# Patient Record
Sex: Male | Born: 1960 | Race: White | Hispanic: No | Smoking: Never smoker
Health system: Southern US, Community
[De-identification: ages and names within clinical notes are randomized; demographics above are authoritative.]

## PROBLEM LIST (undated history)

## (undated) HISTORY — PX: POLYPECTOMY: SHX149

---

## 2015-09-18 ENCOUNTER — Encounter (INDEPENDENT_AMBULATORY_CARE_PROVIDER_SITE_OTHER): Payer: Self-pay

## 2015-09-18 ENCOUNTER — Ambulatory Visit (INDEPENDENT_AMBULATORY_CARE_PROVIDER_SITE_OTHER)
Admission: RE | Admit: 2015-09-18 | Discharge: 2015-09-18 | Disposition: A | Discharge status: Home / Self Care | Payer: 59 | Source: Ambulatory Visit | Attending: Pulmonary Disease | Admitting: Pulmonary Disease

## 2015-09-18 ENCOUNTER — Encounter: Payer: Self-pay | Admitting: Pulmonary Disease

## 2015-09-18 ENCOUNTER — Ambulatory Visit (INDEPENDENT_AMBULATORY_CARE_PROVIDER_SITE_OTHER): Payer: 59 | Admitting: Pulmonary Disease

## 2015-09-18 VITALS — BP 138/82 | HR 89 | Ht 66.5 in | Wt 207.6 lb

## 2015-09-18 DIAGNOSIS — J45990 Exercise induced bronchospasm: Secondary | ICD-10-CM

## 2015-09-18 DIAGNOSIS — R0600 Dyspnea, unspecified: Secondary | ICD-10-CM

## 2015-09-18 MED ORDER — ALBUTEROL SULFATE HFA 108 (90 BASE) MCG/ACT IN AERS: 2.0000 | INHALATION_SPRAY | Freq: Four times a day (QID) | RESPIRATORY_TRACT | Status: AC | PRN

## 2015-09-18 NOTE — Progress Notes (Signed)
Past medical history He  has no past medical history on file.  Past surgical history He  has past surgical history that includes Polypectomy.  Family history His family history includes Colon cancer in his father; Lung cancer in his father.  Social history He  reports that he has never smoked. He does not have any smokeless tobacco history on file. He reports that he does not drink alcohol or use illicit drugs.  No Known Allergies  No current outpatient prescriptions on file prior to visit.   No current facility-administered medications on file prior to visit.    Chief Complaint  Patient presents with  . PULMONARY CONSULT    Increased DOE x 1 year. Self referral.     Vital signs BP 138/82 mmHg  Pulse 89  Ht 5' 6.5" (1.689 m)  Wt 207 lb 9.6 oz (94.167 kg)  BMI 33.01 kg/m2  SpO2 98%  History of present illness [name] [name] is a 55 y.o. male for evaluation of dyspnea.  He is an avid runner, and runs 1/2 marathons.  He has noticed for the past several months it is getting more difficult for him to run.  He will now frequently have to stop and walk because he feels short of breath.    He will feel a tightness in his chest at times when this happens.  His symptoms come on a few minutes after he starts running.  He won't feel better until he slows down.  He gets occasional cough, but no sputum.  He denies hemoptysis or palpitations.  He has not had skin rash or joint pain.  He is not aware of having wheeze.  There is no prior history of asthma, or allergies.  He does not smoke cigarettes.  There is no history of pneumonia or exposure to tuberculosis.  He denies animal/bird exposure, and denies occupational exposures >> works as an Technical sales engineerarchitect.  He has a nephew that has trouble with asthma.  He is not aware of other family members with respiratory problems.  Lab work from 07/07/15 reviewed >> normal except for elevated cholesterol.  Physical exam  General - No distress ENT -  No sinus tenderness, no oral exudate, no LAN, no thyromegaly, TM clear, pupils equal/reactive, scalloped tongue, MP 3 Cardiac - s1s2 regular, no murmur, pulses symmetric Chest - No wheeze/rales/dullness, good air entry, normal respiratory excursion Back - No focal tenderness Abd - Soft, non-tender, no organomegaly, + bowel sounds Ext - No edema Neuro - Normal strength, cranial nerves intact Skin - No rashes Psych - Normal mood, and behavior  ECG today - normal sinus rhythm  Discussion He has dyspnea, cough, and chest tightness with exercise.  His ECG was normal.  I am more concerned that he has exercise induced asthma.   Assessment/plan  Dyspnea with exercise. - chest xray today - will schedule pulmonary function testing - advised him to do high intensity, brief warm routine several minutes prior to starting his full run - he can use ventolin 2 puffs as needed 10 to 15 minutes before starting his run - he might need cardiopulmonary exercise testing to further assess if his symptoms persist    Patient Instructions  Chest xray today Will schedule pulmonary function test Ventolin 2 puffs as needed prior to running Do brief, high intensity warm up prior to running  Follow up in 4 weeks with Dr. Craige CottaSood or nurse practitioner     Coralyn HellingVineet Serjio Deupree, MD [facility] Pager:  [phone] 09/18/2015, 10:48  AM     

## 2015-09-18 NOTE — Patient Instructions (Signed)
Chest xray today Will schedule pulmonary function test Ventolin 2 puffs as needed prior to running Do brief, high intensity warm up prior to running  Follow up in 4 weeks with Dr. Craige CottaSood or nurse practitioner

## 2015-09-18 NOTE — Progress Notes (Signed)
   Subjective:    Patient ID: [name] [name], male    DOB: 1961/05/16, 55 y.o.   MRN: [id number]  HPI    Review of Systems  Constitutional: Positive for unexpected weight change. Negative for fever.  HENT: Negative for congestion, dental problem, ear pain, nosebleeds, postnasal drip, rhinorrhea, sinus pressure, sneezing, sore throat and trouble swallowing.   Eyes: Negative for redness and itching.  Respiratory: Positive for cough and shortness of breath. Negative for chest tightness and wheezing.   Cardiovascular: Negative for palpitations and leg swelling.  Gastrointestinal: Negative for nausea and vomiting.  Genitourinary: Negative for dysuria.  Musculoskeletal: Negative for joint swelling.  Skin: Negative for rash.  Neurological: Negative for headaches.  Hematological: Does not bruise/bleed easily.  Psychiatric/Behavioral: Negative for dysphoric mood. The patient is not nervous/anxious.        Objective:   Physical Exam        Assessment & Plan:

## 2015-09-22 ENCOUNTER — Telehealth: Payer: Self-pay | Admitting: Pulmonary Disease

## 2015-09-22 NOTE — Telephone Encounter (Signed)
Dg Chest 2 View  09/18/2015  CLINICAL DATA:  Dyspnea EXAM: CHEST  2 VIEW COMPARISON:  None. FINDINGS: The heart size and mediastinal contours are within normal limits. Both lungs are clear. The visualized skeletal structures are unremarkable. IMPRESSION: No active cardiopulmonary disease. Electronically Signed   By: Marlan Palauharles  Clark M.D.   On: 09/18/2015 13:20     Will have my nurse inform pt that chest xray was normal.

## 2015-09-23 NOTE — Telephone Encounter (Signed)
LM x 1 

## 2015-09-26 NOTE — Telephone Encounter (Signed)
Patient notified.  No questions or concerns at this time. Nothing further needed.   

## 2015-10-16 ENCOUNTER — Encounter: Payer: Self-pay | Admitting: Adult Health

## 2015-10-16 ENCOUNTER — Ambulatory Visit (INDEPENDENT_AMBULATORY_CARE_PROVIDER_SITE_OTHER): Payer: 59 | Admitting: Adult Health

## 2015-10-16 ENCOUNTER — Ambulatory Visit (HOSPITAL_COMMUNITY)
Admission: RE | Admit: 2015-10-16 | Discharge: 2015-10-16 | Disposition: A | Discharge status: Home / Self Care | Payer: 59 | Source: Ambulatory Visit | Attending: Pulmonary Disease | Admitting: Pulmonary Disease

## 2015-10-16 VITALS — BP 132/80 | HR 68 | Temp 98.2°F | Ht 67.0 in | Wt 204.0 lb

## 2015-10-16 DIAGNOSIS — R0600 Dyspnea, unspecified: Secondary | ICD-10-CM

## 2015-10-16 DIAGNOSIS — R0609 Other forms of dyspnea: Secondary | ICD-10-CM | POA: Diagnosis not present

## 2015-10-16 LAB — PULMONARY FUNCTION TEST
DL/VA % pred: 99 %
DL/VA: 4.41 ml/min/mmHg/L
DLCO UNC % PRED: 93 %
DLCO UNC: 26.49 ml/min/mmHg
FEF 25-75 POST: 4.69 L/s
FEF 25-75 PRE: 3.58 L/s
FEF2575-%Change-Post: 30 %
FEF2575-%PRED-PRE: 125 %
FEF2575-%Pred-Post: 164 %
FEV1-%Change-Post: 7 %
FEV1-%PRED-POST: 112 %
FEV1-%Pred-Pre: 104 %
FEV1-POST: 3.73 L
FEV1-Pre: 3.48 L
FEV1FVC-%Change-Post: 2 %
FEV1FVC-%Pred-Pre: 108 %
FEV6-%CHANGE-POST: 4 %
FEV6-%PRED-POST: 105 %
FEV6-%PRED-PRE: 101 %
FEV6-PRE: 4.22 L
FEV6-Post: 4.4 L
FEV6FVC-%PRED-PRE: 105 %
FEV6FVC-%Pred-Post: 105 %
FVC-%CHANGE-POST: 4 %
FVC-%PRED-POST: 100 %
FVC-%Pred-Pre: 96 %
FVC-Post: 4.4 L
FVC-Pre: 4.22 L
POST FEV6/FVC RATIO: 100 %
PRE FEV1/FVC RATIO: 83 %
Post FEV1/FVC ratio: 85 %
Pre FEV6/FVC Ratio: 100 %
RV % pred: 102 %
RV: 2.07 L
TLC % pred: 105 %
TLC: 6.74 L

## 2015-10-16 MED ORDER — ALBUTEROL SULFATE (2.5 MG/3ML) 0.083% IN NEBU
2.5000 mg | INHALATION_SOLUTION | Freq: Once | RESPIRATORY_TRACT | Status: AC
  Administered 2015-10-16: 2.5 mg via RESPIRATORY_TRACT

## 2015-10-16 NOTE — Progress Notes (Signed)
Subjective:    Patient ID: [name] [name], male    DOB: 08/30/1960, 55 y.o.   MRN: [id number]  HPI 55 yo male never smoker seen for pulmonary consult for dyspnea w/ exercise 09/18/15 -concerning for exercise induced asthma.     10/16/2015 Follow up : DOE  Pt returns for 1 month follow up . Pt was seen 1 month ago for sob that occurs with running only . He was started on albuterol pre exercise. He has not seen much change in DOE-maybe only slightly improved. Marland Kitchen. He feels it is related to his weight gain.  Has gained 10lbs in last but 20lbs in last 3510yrs.  Has been Running for last 5210yrs , running in 1/2 marathons and in running club.  PFT today shows normal lung function with FEV 1 112%, ratio 85  , FVC 100% , No sign BD response. nml dlco .  CXR 09/18/15 nml .  No chest pain, tightness, syncope, dizziness, calf pain, orthopnea, palpitations.  FH Great GF with MI in 3650s.     No past medical history on file. Current Outpatient Prescriptions on File Prior to Visit  Medication Sig Dispense Refill  . albuterol (VENTOLIN HFA) 108 (90 Base) MCG/ACT inhaler Inhale 2 puffs into the lungs every 6 (six) hours as needed for wheezing or shortness of breath. 1 Inhaler 5  . Biotin (BIOTIN 5000) 5 MG CAPS Take 5,000 mg by mouth daily.    . Multiple Vitamins-Minerals (OCUVITE EYE + MULTI PO) Take 1 capsule by mouth daily.    . Omega-3 Fatty Acids (FISH OIL PO) Take 2 capsules by mouth daily. 1050mg  each     No current facility-administered medications on file prior to visit.     Review of Systems Constitutional:   No  weight loss, night sweats,  Fevers, chills, fatigue, or  lassitude.  HEENT:   No headaches,  Difficulty swallowing,  Tooth/dental problems, or  Sore throat,                No sneezing, itching, ear ache, nasal congestion, post nasal drip,   CV:  No chest pain,  Orthopnea, PND, swelling in lower extremities, anasarca, dizziness, palpitations, syncope.   GI  No heartburn, indigestion,  abdominal pain, nausea, vomiting, diarrhea, change in bowel habits, loss of appetite, bloody stools.   Resp:  .  No excess mucus, no productive cough,  No non-productive cough,  No coughing up of blood.  No change in color of mucus.  No wheezing.  No chest wall deformity  Skin: no rash or lesions.  GU: no dysuria, change in color of urine, no urgency or frequency.  No flank pain, no hematuria   MS:  No joint pain or swelling.  No decreased range of motion.  No back pain.  Psych:  No change in mood or affect. No depression or anxiety.  No memory loss.         Objective:   Physical Exam  Filed Vitals:   10/16/15 1416  BP: 132/80  Pulse: 68  Temp: 98.2 F (36.8 C)  TempSrc: Oral  Height: 5\' 7"  (1.702 m)  Weight: 204 lb (92.534 kg)  SpO2: 95%  Body mass index is 31.94 kg/(m^2).   GEN: A/Ox3; pleasant , NAD, well nourished   HEENT:  [place]/AT,  EACs-clear, TMs-wnl, NOSE-clear, THROAT-clear, no lesions, no postnasal drip or exudate noted.   NECK:  Supple w/ fair ROM; no JVD; normal carotid impulses w/o bruits; no thyromegaly or nodules palpated;  no lymphadenopathy.  RESP  Clear  P & A; w/o, wheezes/ rales/ or rhonchi.no accessory muscle use, no dullness to percussion  CARD:  RRR, no m/r/g  , no peripheral edema, pulses intact, no cyanosis or clubbing.  GI:   Soft & nt; nml bowel sounds; no organomegaly or masses detected.  Musco: Warm bil, no deformities or joint swelling noted.   Neuro: alert, no focal deficits noted.    Skin: Warm, no lesions or rashes  Tammy Parrett NP-C  [facility]  10/16/2015      Assessment & Plan:

## 2015-10-16 NOTE — Patient Instructions (Signed)
May use Ventolin 2 puffs as needed prior to running Recommend Cardiopulmonary stress test or Cardiology referral call back if you change your mind.  follow up Dr. Craige CottaSood  In 4-6 months and As needed

## 2015-10-17 DIAGNOSIS — R0609 Other forms of dyspnea: Secondary | ICD-10-CM | POA: Insufficient documentation

## 2015-10-17 NOTE — Assessment & Plan Note (Signed)
Dyspnea with exercise ? Etiology  May have component of exercise induced asthma , PFT is normal with no restriction or obstruction.  May cont to use albuterol if perceived benefit Advised since we do not have a clear cut answer to explain DOE , should procede with CPST or cardiology referral  He declines. He is convinced this is weight related and deconditioning.  He wants to lose weight and then reevaluate if does not resolve Advised of cardiac potential. He declines referral at this time.   Plan  ollow med calendar closely and bring to each visit.  Please contact office for sooner follow up if symptoms do not improve or worsen or seek emergency care   Saline nasal rinses as needed.  Saline nasal gel At bedtime  As needed   Follow up Dr. Sherene SiresWert  In 3-4 months and As needed.  Pneumovax vaccine today .

## 2015-10-20 NOTE — Progress Notes (Signed)
Reviewed and agree with assessment/plan.  Coralyn HellingVineet Aleksa Collinsworth, MD [facility] 10/20/2015, 3:14 PM Pager:  [phone]

## 2015-10-23 ENCOUNTER — Encounter: Payer: Self-pay | Admitting: Pulmonary Disease

## 2017-07-07 DIAGNOSIS — Z0000 Encounter for general adult medical examination without abnormal findings: Secondary | ICD-10-CM | POA: Diagnosis not present

## 2017-07-07 DIAGNOSIS — Z125 Encounter for screening for malignant neoplasm of prostate: Secondary | ICD-10-CM | POA: Diagnosis not present

## 2017-07-14 DIAGNOSIS — Z1331 Encounter for screening for depression: Secondary | ICD-10-CM | POA: Diagnosis not present

## 2017-07-14 DIAGNOSIS — Z0000 Encounter for general adult medical examination without abnormal findings: Secondary | ICD-10-CM | POA: Diagnosis not present

## 2017-07-14 DIAGNOSIS — I10 Essential (primary) hypertension: Secondary | ICD-10-CM | POA: Diagnosis not present

## 2017-07-14 DIAGNOSIS — D6489 Other specified anemias: Secondary | ICD-10-CM | POA: Diagnosis not present

## 2017-07-14 DIAGNOSIS — E7800 Pure hypercholesterolemia, unspecified: Secondary | ICD-10-CM | POA: Diagnosis not present

## 2017-07-14 DIAGNOSIS — K900 Celiac disease: Secondary | ICD-10-CM | POA: Diagnosis not present

## 2017-07-14 DIAGNOSIS — Z1389 Encounter for screening for other disorder: Secondary | ICD-10-CM | POA: Diagnosis not present

## 2017-08-29 DIAGNOSIS — L820 Inflamed seborrheic keratosis: Secondary | ICD-10-CM | POA: Diagnosis not present

## 2017-08-29 DIAGNOSIS — D1801 Hemangioma of skin and subcutaneous tissue: Secondary | ICD-10-CM | POA: Diagnosis not present

## 2017-08-29 DIAGNOSIS — C44329 Squamous cell carcinoma of skin of other parts of face: Secondary | ICD-10-CM | POA: Diagnosis not present

## 2017-09-21 DIAGNOSIS — C44329 Squamous cell carcinoma of skin of other parts of face: Secondary | ICD-10-CM | POA: Diagnosis not present

## 2017-09-27 DIAGNOSIS — D485 Neoplasm of uncertain behavior of skin: Secondary | ICD-10-CM | POA: Diagnosis not present

## 2017-09-27 DIAGNOSIS — L814 Other melanin hyperpigmentation: Secondary | ICD-10-CM | POA: Diagnosis not present

## 2017-10-05 DIAGNOSIS — K900 Celiac disease: Secondary | ICD-10-CM | POA: Diagnosis not present

## 2017-10-05 DIAGNOSIS — M8580 Other specified disorders of bone density and structure, unspecified site: Secondary | ICD-10-CM | POA: Diagnosis not present

## 2017-10-05 DIAGNOSIS — D509 Iron deficiency anemia, unspecified: Secondary | ICD-10-CM | POA: Diagnosis not present

## 2018-04-10 DIAGNOSIS — Z85828 Personal history of other malignant neoplasm of skin: Secondary | ICD-10-CM | POA: Diagnosis not present

## 2018-04-10 DIAGNOSIS — D2261 Melanocytic nevi of right upper limb, including shoulder: Secondary | ICD-10-CM | POA: Diagnosis not present

## 2018-04-10 DIAGNOSIS — L570 Actinic keratosis: Secondary | ICD-10-CM | POA: Diagnosis not present

## 2018-04-10 DIAGNOSIS — C44519 Basal cell carcinoma of skin of other part of trunk: Secondary | ICD-10-CM | POA: Diagnosis not present

## 2018-09-13 DIAGNOSIS — E7800 Pure hypercholesterolemia, unspecified: Secondary | ICD-10-CM | POA: Diagnosis not present

## 2018-09-13 DIAGNOSIS — Z125 Encounter for screening for malignant neoplasm of prostate: Secondary | ICD-10-CM | POA: Diagnosis not present

## 2018-09-13 DIAGNOSIS — R8290 Unspecified abnormal findings in urine: Secondary | ICD-10-CM | POA: Diagnosis not present

## 2018-09-13 DIAGNOSIS — I10 Essential (primary) hypertension: Secondary | ICD-10-CM | POA: Diagnosis not present

## 2018-09-13 DIAGNOSIS — Z0000 Encounter for general adult medical examination without abnormal findings: Secondary | ICD-10-CM | POA: Diagnosis not present

## 2018-09-19 DIAGNOSIS — N3281 Overactive bladder: Secondary | ICD-10-CM | POA: Diagnosis not present

## 2018-09-19 DIAGNOSIS — I10 Essential (primary) hypertension: Secondary | ICD-10-CM | POA: Diagnosis not present

## 2018-09-19 DIAGNOSIS — Z1331 Encounter for screening for depression: Secondary | ICD-10-CM | POA: Diagnosis not present

## 2018-09-19 DIAGNOSIS — E291 Testicular hypofunction: Secondary | ICD-10-CM | POA: Diagnosis not present

## 2018-09-19 DIAGNOSIS — E7800 Pure hypercholesterolemia, unspecified: Secondary | ICD-10-CM | POA: Diagnosis not present

## 2018-09-19 DIAGNOSIS — Z0000 Encounter for general adult medical examination without abnormal findings: Secondary | ICD-10-CM | POA: Diagnosis not present

## 2019-04-13 DIAGNOSIS — C44519 Basal cell carcinoma of skin of other part of trunk: Secondary | ICD-10-CM | POA: Diagnosis not present

## 2019-04-13 DIAGNOSIS — L814 Other melanin hyperpigmentation: Secondary | ICD-10-CM | POA: Diagnosis not present

## 2019-04-13 DIAGNOSIS — L853 Xerosis cutis: Secondary | ICD-10-CM | POA: Diagnosis not present

## 2019-04-13 DIAGNOSIS — D1801 Hemangioma of skin and subcutaneous tissue: Secondary | ICD-10-CM | POA: Diagnosis not present

## 2019-04-13 DIAGNOSIS — L570 Actinic keratosis: Secondary | ICD-10-CM | POA: Diagnosis not present

## 2019-04-13 DIAGNOSIS — Z85828 Personal history of other malignant neoplasm of skin: Secondary | ICD-10-CM | POA: Diagnosis not present

## 2019-06-13 DIAGNOSIS — K900 Celiac disease: Secondary | ICD-10-CM | POA: Diagnosis not present

## 2019-06-13 DIAGNOSIS — D509 Iron deficiency anemia, unspecified: Secondary | ICD-10-CM | POA: Diagnosis not present

## 2019-06-27 DIAGNOSIS — D509 Iron deficiency anemia, unspecified: Secondary | ICD-10-CM | POA: Diagnosis not present

## 2019-06-27 DIAGNOSIS — K900 Celiac disease: Secondary | ICD-10-CM | POA: Diagnosis not present

## 2019-10-23 DIAGNOSIS — Z0000 Encounter for general adult medical examination without abnormal findings: Secondary | ICD-10-CM | POA: Diagnosis not present

## 2019-10-23 DIAGNOSIS — Z125 Encounter for screening for malignant neoplasm of prostate: Secondary | ICD-10-CM | POA: Diagnosis not present

## 2019-10-23 DIAGNOSIS — E7800 Pure hypercholesterolemia, unspecified: Secondary | ICD-10-CM | POA: Diagnosis not present

## 2019-10-23 DIAGNOSIS — E291 Testicular hypofunction: Secondary | ICD-10-CM | POA: Diagnosis not present

## 2019-10-30 ENCOUNTER — Other Ambulatory Visit: Payer: Self-pay | Admitting: Internal Medicine

## 2019-10-30 DIAGNOSIS — Z1331 Encounter for screening for depression: Secondary | ICD-10-CM | POA: Diagnosis not present

## 2019-10-30 DIAGNOSIS — R82998 Other abnormal findings in urine: Secondary | ICD-10-CM | POA: Diagnosis not present

## 2019-10-30 DIAGNOSIS — D126 Benign neoplasm of colon, unspecified: Secondary | ICD-10-CM | POA: Diagnosis not present

## 2019-10-30 DIAGNOSIS — E7800 Pure hypercholesterolemia, unspecified: Secondary | ICD-10-CM | POA: Diagnosis not present

## 2019-10-30 DIAGNOSIS — K900 Celiac disease: Secondary | ICD-10-CM | POA: Diagnosis not present

## 2019-10-30 DIAGNOSIS — I10 Essential (primary) hypertension: Secondary | ICD-10-CM | POA: Diagnosis not present

## 2019-10-30 DIAGNOSIS — Z0000 Encounter for general adult medical examination without abnormal findings: Secondary | ICD-10-CM | POA: Diagnosis not present

## 2019-12-03 ENCOUNTER — Ambulatory Visit
Admission: RE | Admit: 2019-12-03 | Discharge: 2019-12-03 | Disposition: A | Discharge status: Home / Self Care | Payer: 59 | Source: Ambulatory Visit | Attending: Internal Medicine | Admitting: Internal Medicine

## 2019-12-03 DIAGNOSIS — E7800 Pure hypercholesterolemia, unspecified: Secondary | ICD-10-CM

## 2019-12-17 ENCOUNTER — Encounter: Payer: Self-pay | Admitting: General Practice

## 2020-04-18 DIAGNOSIS — L814 Other melanin hyperpigmentation: Secondary | ICD-10-CM | POA: Diagnosis not present

## 2020-04-18 DIAGNOSIS — L570 Actinic keratosis: Secondary | ICD-10-CM | POA: Diagnosis not present

## 2020-04-18 DIAGNOSIS — Z85828 Personal history of other malignant neoplasm of skin: Secondary | ICD-10-CM | POA: Diagnosis not present

## 2020-04-18 DIAGNOSIS — D1801 Hemangioma of skin and subcutaneous tissue: Secondary | ICD-10-CM | POA: Diagnosis not present

## 2020-04-18 DIAGNOSIS — L820 Inflamed seborrheic keratosis: Secondary | ICD-10-CM | POA: Diagnosis not present

## 2020-10-08 DIAGNOSIS — K900 Celiac disease: Secondary | ICD-10-CM | POA: Diagnosis not present

## 2020-10-15 DIAGNOSIS — K900 Celiac disease: Secondary | ICD-10-CM | POA: Diagnosis not present

## 2020-10-15 DIAGNOSIS — Z86010 Personal history of colonic polyps: Secondary | ICD-10-CM | POA: Diagnosis not present

## 2020-10-15 DIAGNOSIS — K625 Hemorrhage of anus and rectum: Secondary | ICD-10-CM | POA: Diagnosis not present

## 2020-10-15 DIAGNOSIS — Z800 Family history of malignant neoplasm of digestive organs: Secondary | ICD-10-CM | POA: Diagnosis not present

## 2020-12-02 DIAGNOSIS — Z800 Family history of malignant neoplasm of digestive organs: Secondary | ICD-10-CM | POA: Diagnosis not present

## 2020-12-02 DIAGNOSIS — D120 Benign neoplasm of cecum: Secondary | ICD-10-CM | POA: Diagnosis not present

## 2020-12-02 DIAGNOSIS — Z86010 Personal history of colonic polyps: Secondary | ICD-10-CM | POA: Diagnosis not present

## 2021-01-13 DIAGNOSIS — E291 Testicular hypofunction: Secondary | ICD-10-CM | POA: Diagnosis not present

## 2021-01-13 DIAGNOSIS — E7800 Pure hypercholesterolemia, unspecified: Secondary | ICD-10-CM | POA: Diagnosis not present

## 2021-01-13 DIAGNOSIS — R7301 Impaired fasting glucose: Secondary | ICD-10-CM | POA: Diagnosis not present

## 2021-01-13 DIAGNOSIS — Z125 Encounter for screening for malignant neoplasm of prostate: Secondary | ICD-10-CM | POA: Diagnosis not present

## 2021-01-20 DIAGNOSIS — Z0000 Encounter for general adult medical examination without abnormal findings: Secondary | ICD-10-CM | POA: Diagnosis not present

## 2021-01-20 DIAGNOSIS — Z1339 Encounter for screening examination for other mental health and behavioral disorders: Secondary | ICD-10-CM | POA: Diagnosis not present

## 2021-01-20 DIAGNOSIS — Z1331 Encounter for screening for depression: Secondary | ICD-10-CM | POA: Diagnosis not present

## 2021-01-20 DIAGNOSIS — I10 Essential (primary) hypertension: Secondary | ICD-10-CM | POA: Diagnosis not present

## 2021-01-20 DIAGNOSIS — R82998 Other abnormal findings in urine: Secondary | ICD-10-CM | POA: Diagnosis not present

## 2021-03-23 DIAGNOSIS — Z03818 Encounter for observation for suspected exposure to other biological agents ruled out: Secondary | ICD-10-CM | POA: Diagnosis not present

## 2021-03-23 DIAGNOSIS — U071 COVID-19: Secondary | ICD-10-CM | POA: Diagnosis not present

## 2021-04-07 DIAGNOSIS — U071 COVID-19: Secondary | ICD-10-CM | POA: Diagnosis not present

## 2021-04-20 DIAGNOSIS — L570 Actinic keratosis: Secondary | ICD-10-CM | POA: Diagnosis not present

## 2021-04-20 DIAGNOSIS — L820 Inflamed seborrheic keratosis: Secondary | ICD-10-CM | POA: Diagnosis not present

## 2021-04-20 DIAGNOSIS — D2261 Melanocytic nevi of right upper limb, including shoulder: Secondary | ICD-10-CM | POA: Diagnosis not present

## 2021-04-20 DIAGNOSIS — L814 Other melanin hyperpigmentation: Secondary | ICD-10-CM | POA: Diagnosis not present

## 2021-04-20 DIAGNOSIS — D1801 Hemangioma of skin and subcutaneous tissue: Secondary | ICD-10-CM | POA: Diagnosis not present

## 2021-04-20 DIAGNOSIS — Z85828 Personal history of other malignant neoplasm of skin: Secondary | ICD-10-CM | POA: Diagnosis not present

## 2021-06-20 IMAGING — CT CT CARDIAC CORONARY ARTERY CALCIUM SCORE
3 series · 14 of 20 positions shown, 16 images · non-contrast
Comparison: None.

CLINICAL DATA: 59-year-old white male with history of increased
cholesterol.

EXAM:
CT CARDIAC CORONARY ARTERY CALCIUM SCORE
TECHNIQUE: Non-contrast imaging through the heart was performed using
prospective ECG gating. Image post processing was performed on an
independent workstation, allowing for quantitative analysis of the
heart and coronary arteries. Note that this exam targets the heart
and the chest was not imaged in its entirety.

[Series 2: calcium scoring 2.00 qr36 bestdiast 71% hrt calciu · axial · 0.49mm/px · z∈[+1447,+1531]mm · 4 of 70 slices shown]
[im 14/70  vessel]
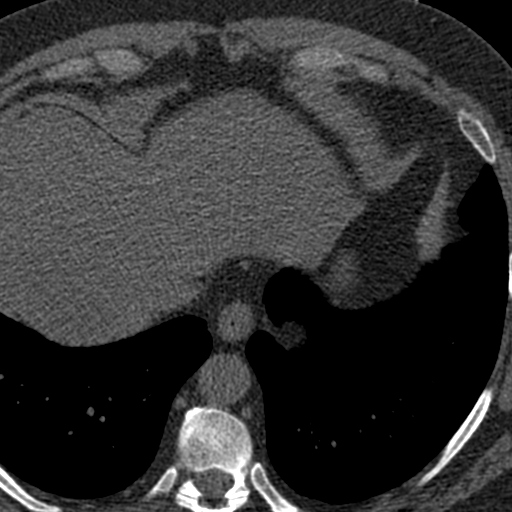
[im 28/70  vessel]
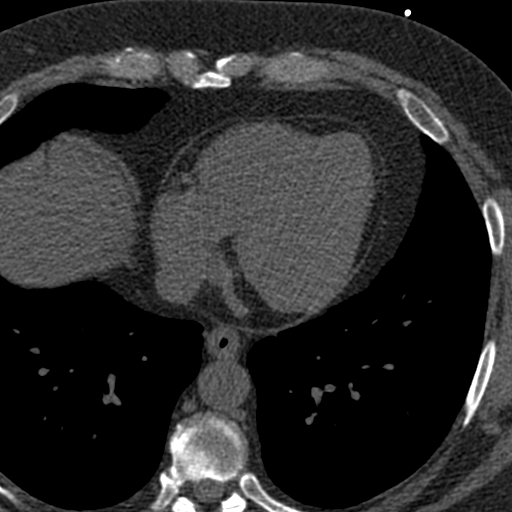
[im 42/70  vessel]
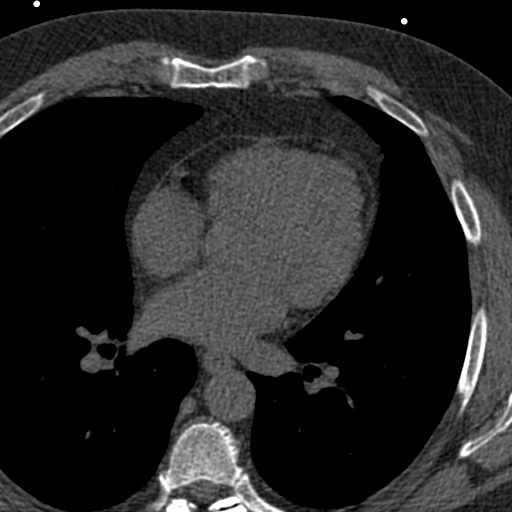
[im 56/70  vessel]
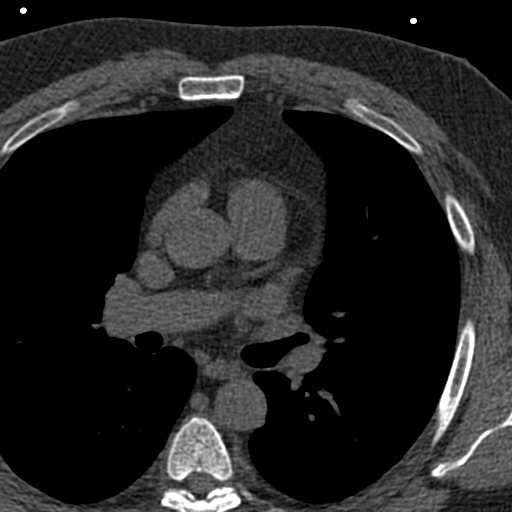

[Series 3: calcium scoring 2.00 br40 bestdiast 71% axial · axial · 0.63mm/px · z∈[+1443,+1535]mm · 5 of 70 slices shown, 7 images]
[im 12/70  vessel]
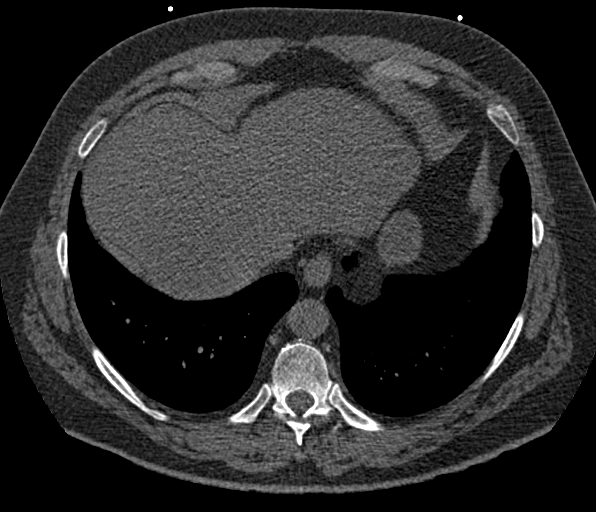
[im 12/70  lung]
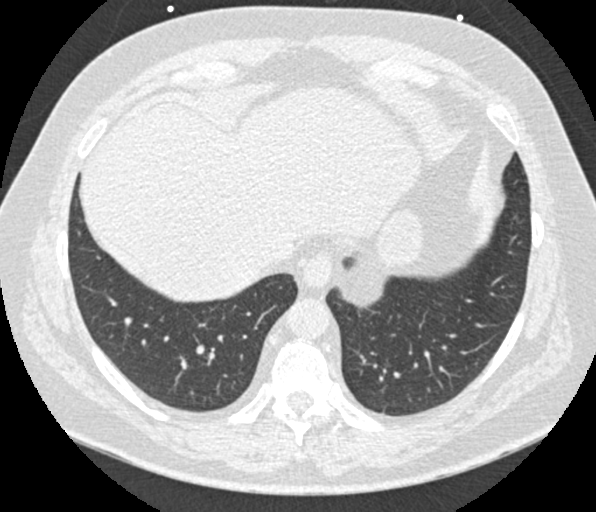
[im 24/70  vessel]
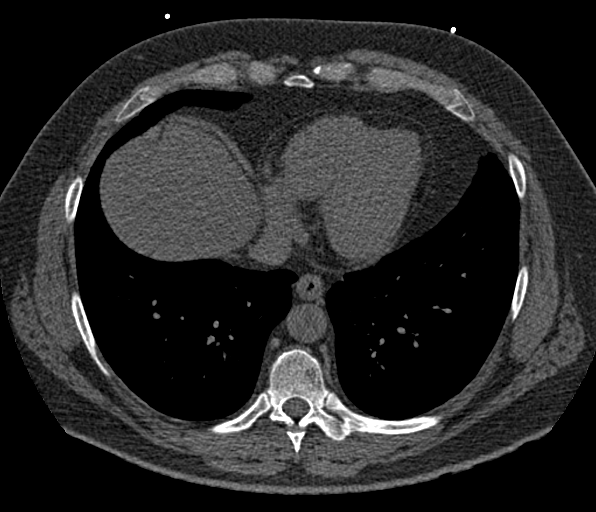
[im 35/70  vessel]
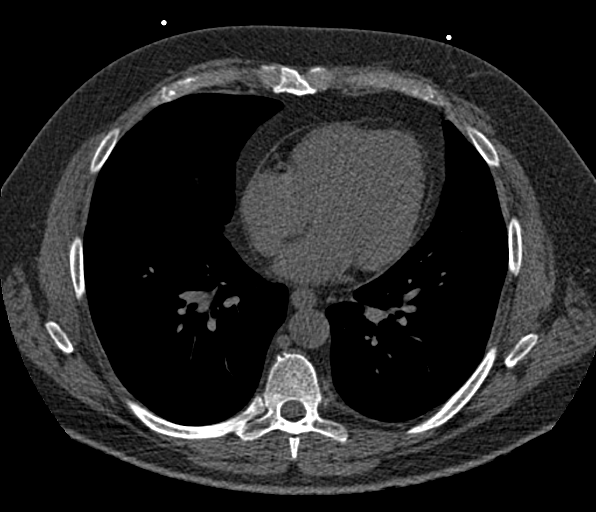
[im 47/70  vessel]
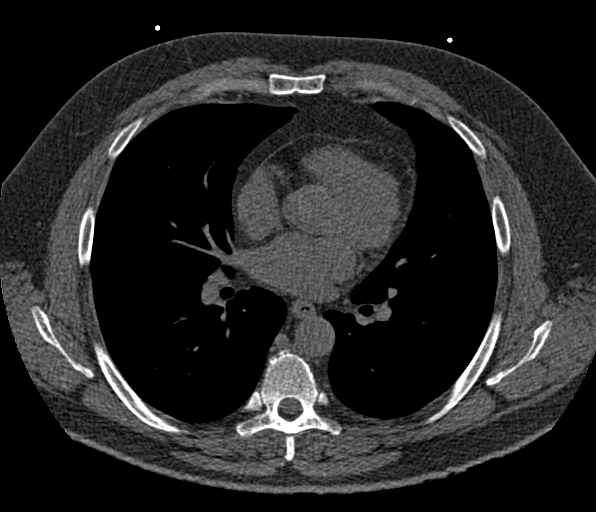
[im 58/70  vessel]
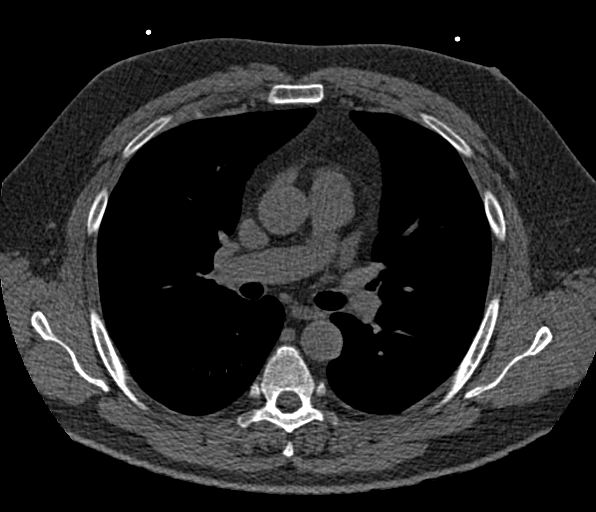
[im 58/70  lung]
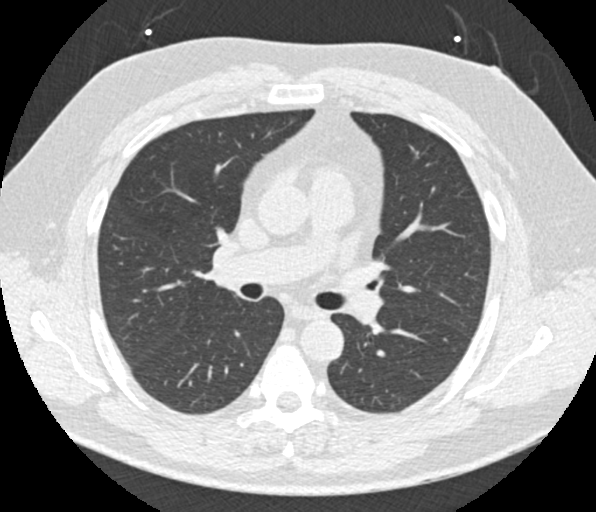

[Series 9: calcium scoring 2.00 br60 bestdiast 71% lungs · axial · 0.63mm/px · z∈[+1443,+1535]mm · 5 of 70 slices shown]
[im 12/70  vessel]
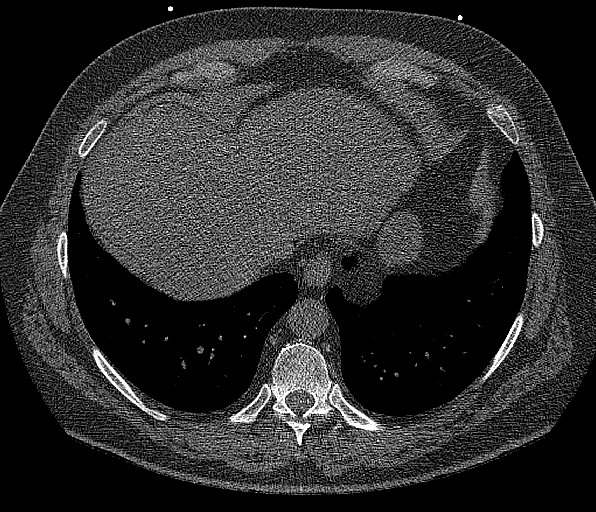
[im 24/70  vessel]
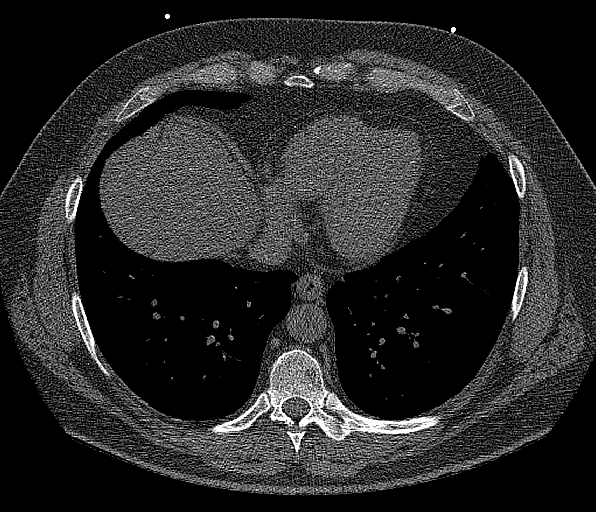
[im 35/70  vessel]
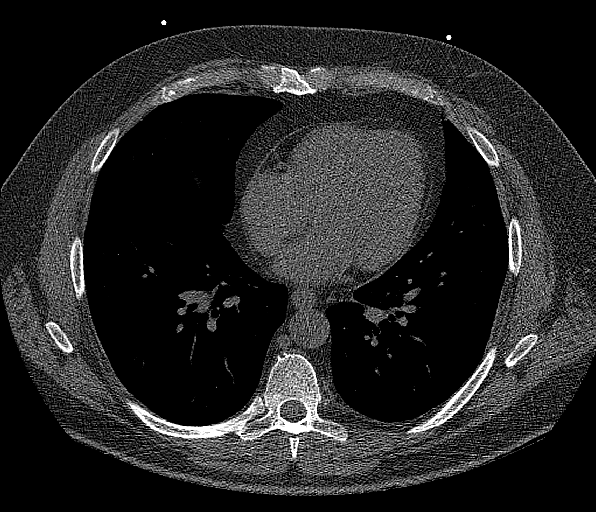
[im 47/70  vessel]
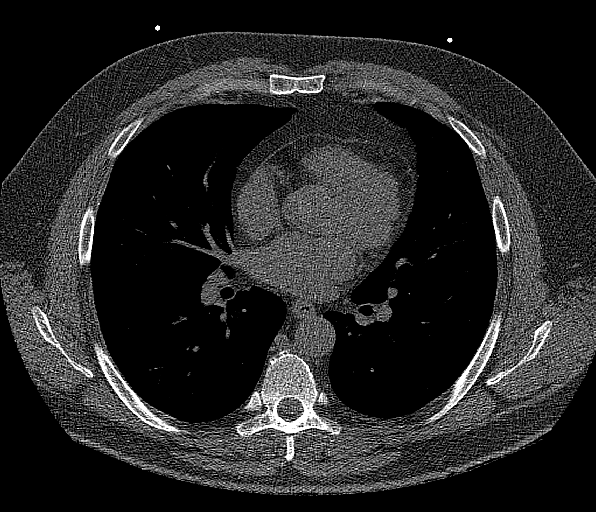
[im 58/70  vessel]
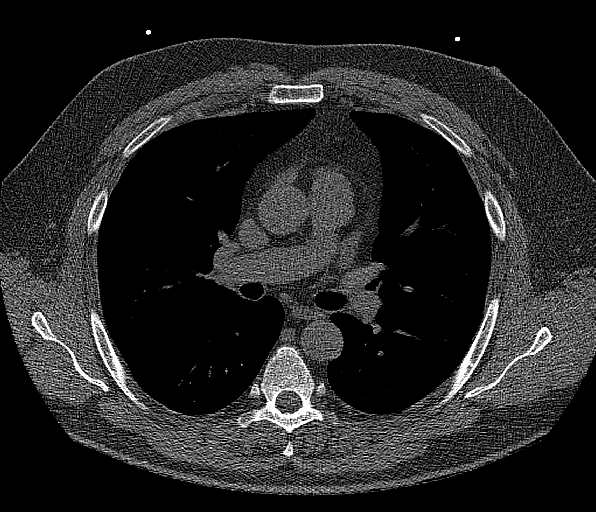

[14 of 20 positions shown; findings below may reference images not displayed]

FINDINGS: CORONARY CALCIUM SCORES:

Left Main: 0

LAD: 174

LCx:

RCA:

Total Agatston Score: 250

[HOSPITAL] percentile: 80

AORTA MEASUREMENTS:

Ascending Aorta: 31 mm

Descending Aorta: 27 mm

OTHER FINDINGS:

Heart size is normal. No significant pericardial fluid. No definite
calcifications in the visualized portions of the thoracic aorta.
Scattered small mediastinal lymph nodes. Images of upper abdomen are
unremarkable. Tiny pleural-based calcification in the right middle
lobe region on sequence 9, image 27 is suggestive for a tiny
calcified granuloma. Focal thickening along the left major fissure
on sequence 9, image 18 measures roughly 5 mm. Tiny peripheral
nodule in the lingula on sequence 9, image 37. Noncalcified
peripheral nodule in the left lower lobe on sequence 9 image 45
measures up to 5 mm. No large pleural effusions. No significant
airspace disease or lung consolidation. Bone structures are
unremarkable.
IMPRESSION: 1. Coronary calcium score is 250 and this is at percentile 80 for
patients of the same age, gender and ethnicity.
2. Small pulmonary nodules, predominantly peripheral and pleural
based. Largest pulmonary nodule measures 5 mm. No follow-up needed
if patient is low-risk (and has no known or suspected primary
neoplasm). Non-contrast chest CT can be considered in 12 months if
patient is high-risk. This recommendation follows the consensus
statement: Guidelines for Management of Incidental Pulmonary Nodules
Detected on CT Images: From the [HOSPITAL] 6833; Radiology

## 2022-01-19 DIAGNOSIS — E7800 Pure hypercholesterolemia, unspecified: Secondary | ICD-10-CM | POA: Diagnosis not present

## 2022-01-19 DIAGNOSIS — E291 Testicular hypofunction: Secondary | ICD-10-CM | POA: Diagnosis not present

## 2022-01-19 DIAGNOSIS — Z125 Encounter for screening for malignant neoplasm of prostate: Secondary | ICD-10-CM | POA: Diagnosis not present

## 2022-01-19 DIAGNOSIS — D649 Anemia, unspecified: Secondary | ICD-10-CM | POA: Diagnosis not present

## 2022-01-19 DIAGNOSIS — R7301 Impaired fasting glucose: Secondary | ICD-10-CM | POA: Diagnosis not present

## 2022-01-26 DIAGNOSIS — R7301 Impaired fasting glucose: Secondary | ICD-10-CM | POA: Diagnosis not present

## 2022-01-26 DIAGNOSIS — Z1331 Encounter for screening for depression: Secondary | ICD-10-CM | POA: Diagnosis not present

## 2022-01-26 DIAGNOSIS — Z1339 Encounter for screening examination for other mental health and behavioral disorders: Secondary | ICD-10-CM | POA: Diagnosis not present

## 2022-01-26 DIAGNOSIS — I10 Essential (primary) hypertension: Secondary | ICD-10-CM | POA: Diagnosis not present

## 2022-01-26 DIAGNOSIS — Z0000 Encounter for general adult medical examination without abnormal findings: Secondary | ICD-10-CM | POA: Diagnosis not present

## 2022-01-26 DIAGNOSIS — R82998 Other abnormal findings in urine: Secondary | ICD-10-CM | POA: Diagnosis not present

## 2022-04-26 DIAGNOSIS — C4441 Basal cell carcinoma of skin of scalp and neck: Secondary | ICD-10-CM | POA: Diagnosis not present

## 2022-04-26 DIAGNOSIS — L570 Actinic keratosis: Secondary | ICD-10-CM | POA: Diagnosis not present

## 2022-04-26 DIAGNOSIS — D1801 Hemangioma of skin and subcutaneous tissue: Secondary | ICD-10-CM | POA: Diagnosis not present

## 2022-04-26 DIAGNOSIS — L814 Other melanin hyperpigmentation: Secondary | ICD-10-CM | POA: Diagnosis not present

## 2022-04-26 DIAGNOSIS — Z85828 Personal history of other malignant neoplasm of skin: Secondary | ICD-10-CM | POA: Diagnosis not present

## 2022-04-26 DIAGNOSIS — L820 Inflamed seborrheic keratosis: Secondary | ICD-10-CM | POA: Diagnosis not present

## 2023-01-24 DIAGNOSIS — Z125 Encounter for screening for malignant neoplasm of prostate: Secondary | ICD-10-CM | POA: Diagnosis not present

## 2023-01-24 DIAGNOSIS — D649 Anemia, unspecified: Secondary | ICD-10-CM | POA: Diagnosis not present

## 2023-01-24 DIAGNOSIS — Z1212 Encounter for screening for malignant neoplasm of rectum: Secondary | ICD-10-CM | POA: Diagnosis not present

## 2023-01-24 DIAGNOSIS — I10 Essential (primary) hypertension: Secondary | ICD-10-CM | POA: Diagnosis not present

## 2023-01-24 DIAGNOSIS — E7800 Pure hypercholesterolemia, unspecified: Secondary | ICD-10-CM | POA: Diagnosis not present

## 2023-01-24 DIAGNOSIS — E291 Testicular hypofunction: Secondary | ICD-10-CM | POA: Diagnosis not present

## 2023-01-31 DIAGNOSIS — R82998 Other abnormal findings in urine: Secondary | ICD-10-CM | POA: Diagnosis not present

## 2023-01-31 DIAGNOSIS — Z0000 Encounter for general adult medical examination without abnormal findings: Secondary | ICD-10-CM | POA: Diagnosis not present

## 2023-01-31 DIAGNOSIS — I10 Essential (primary) hypertension: Secondary | ICD-10-CM | POA: Diagnosis not present

## 2023-04-27 DIAGNOSIS — L814 Other melanin hyperpigmentation: Secondary | ICD-10-CM | POA: Diagnosis not present

## 2023-04-27 DIAGNOSIS — C4441 Basal cell carcinoma of skin of scalp and neck: Secondary | ICD-10-CM | POA: Diagnosis not present

## 2023-04-27 DIAGNOSIS — D1801 Hemangioma of skin and subcutaneous tissue: Secondary | ICD-10-CM | POA: Diagnosis not present

## 2023-04-27 DIAGNOSIS — Z85828 Personal history of other malignant neoplasm of skin: Secondary | ICD-10-CM | POA: Diagnosis not present

## 2023-04-27 DIAGNOSIS — L570 Actinic keratosis: Secondary | ICD-10-CM | POA: Diagnosis not present

## 2023-05-01 DIAGNOSIS — R509 Fever, unspecified: Secondary | ICD-10-CM | POA: Diagnosis not present

## 2023-05-01 DIAGNOSIS — J028 Acute pharyngitis due to other specified organisms: Secondary | ICD-10-CM | POA: Diagnosis not present

## 2023-05-01 DIAGNOSIS — B9689 Other specified bacterial agents as the cause of diseases classified elsewhere: Secondary | ICD-10-CM | POA: Diagnosis not present

## 2023-05-13 ENCOUNTER — Other Ambulatory Visit: Payer: Self-pay

## 2023-05-13 ENCOUNTER — Ambulatory Visit (INDEPENDENT_AMBULATORY_CARE_PROVIDER_SITE_OTHER): Payer: BC Managed Care – PPO | Admitting: Sports Medicine

## 2023-05-13 VITALS — BP 124/88 | Ht 67.0 in | Wt 220.0 lb

## 2023-05-13 DIAGNOSIS — M25561 Pain in right knee: Secondary | ICD-10-CM | POA: Diagnosis not present

## 2023-05-13 MED ORDER — MELOXICAM 15 MG PO TABS: 15.0000 mg | ORAL_TABLET | Freq: Every day | ORAL | 0 refills | Status: AC

## 2023-05-13 NOTE — Patient Instructions (Signed)
You were seen for right knee pain. I think this is a transient synovitis with prepatellar bursitis. I have sent 3 weeks of Meloxicam (NSAID) to take once daily for the next 14 days as needed, though you can continue for the full 3 weeks if needed. Icing and keeping compression on the knee can help with pain, swelling and inflammation.  Let's follow-up in 2-3 weeks, sooner if the knee becomes red, swollen or if you are unable to weight bear on that leg.

## 2023-05-13 NOTE — Progress Notes (Signed)
PCP: Tisovec, Adelfa Koh, MD  SUBJECTIVE:   HPI:  Patient is a 62 y.o. male here with chief complaint of right knee pain.   He has had 1 week of right anterior knee pain with associated difficulty and pain with weightbearing.  He states that 2 weeks ago he developed a sore throat with low-grade fevers and cervical lymphadenopathy.  He was seen at [facility] and diagnosed with bacterial pharyngitis and started on a 10-day course of amoxicillin.  He completed the entire course of amoxicillin.  However, on day 5 of treatment he did develop acute onset pain of his right knee.  He located the pain over his right patella with associated swelling and warmth.  He denies any redness of the joint.  He did have some pain and difficulty with weightbearing and leg extension but this has gradually improved some.  Last week he states that even very light touch over the top of his knee would cause him significant discomfort.  He has been taking Tylenol which is moderately helpful for the pain.  He is taking no anti-inflammatory medication.  He has no history of rheumatic fever.  He has had no fevers or chills since starting antibiotics.  ROS:     See HPI  PERTINENT  PMH / PSH FH / / SH:  Past Medical, Surgical, Social, and Family History Reviewed & Updated in the EMR.  Pertinent findings include:  No significant history.  No past medical history on file.  No Known Allergies  OBJECTIVE:  BP (!) 142/96   Ht 5\' 7"  (1.702 m)   Wt 220 lb (99.8 kg)   BMI 34.46 kg/m   PHYSICAL EXAM:  GEN: Alert and Oriented, NAD, comfortable in exam room RESP: Unlabored respirations, symmetric chest rise PSY: normal mood, congruent affect   Right Knee MSK EXAM: No gross deformity, ecchymoses, redness, or effusion. Mild prepatellar swelling and warmth to touch. TTP along entire prepatellar area and distal quad. Mild medial joint line TTP. No posterior tenderness. No patellar tendon TTP. FROM with normal strength,  thuogh some prepatellar pain with resisted knee extension. Negative ant/post drawers. Negative valgus/varus testing. Negative lachman.  Negative mcmurrays, apleys.  NV intact distally. Normal Gait.  Limited MSK U/S of the Right Knee: -Prepatellar space: There is normal healthy appearing bony cortex to the patella.  To superficial to the patella there is a thin line of hypoechoic fluid collection with hyperemia on Doppler. -Suprapatellar space: There is no effusion present.  Healthy appearing distal quadriceps tendon without significant hyperemia on Doppler.  Impression: Prepatellar bursitis. U/S performed and interpreted by Glean Salen, MD with supervision of Darene Lamer, DO.   Assessment & Plan Acute pain of right knee Acute right knee pain in the setting of recent pharyngitis most likely consistent with a reactive synovitis with prepatellar bursitis.  Reassuringly there is no knee effusion on limited ultrasound performed as described above.  He completed a full course of amoxicillin so I think complicating infection such as rheumatic fever seems unlikely at this point, however reactive arthritis does remain on the differential.  Plan: -Prescription for meloxicam 15 mg daily to take as needed over the next 14 to 21 days provided. -Advised to continue Tylenol prn, as well as also to ice and keep compression over the right knee over the next 2 weeks. ACE bandage placed today. -If pain or swelling is persisting would recommend re-scanning for effusion and getting CBC, CRP, ESR, +/- ASO titer at follow-up in 2  weeks. Return precautions reviewed for worsening swelling, redness or inability to WB.   Glean Salen, MD PGY-4, Sports Medicine Fellow [facility]  Addendum:  Patient seen and examined in the office by fellow.   History, exam, plan of care were precepted with me.  Agree with findings as documented in fellow note.  Darene Lamer, DO, CAQSM

## 2023-06-10 ENCOUNTER — Ambulatory Visit (INDEPENDENT_AMBULATORY_CARE_PROVIDER_SITE_OTHER): Payer: 59 | Admitting: Sports Medicine

## 2023-06-10 VITALS — BP 136/84 | Ht 67.0 in | Wt 220.0 lb

## 2023-06-10 DIAGNOSIS — M25561 Pain in right knee: Secondary | ICD-10-CM | POA: Diagnosis not present

## 2023-06-10 NOTE — Progress Notes (Signed)
   PCP: Tisovec, Richard W, MD  SUBJECTIVE:   HPI:  Patient is a 63 y.o. male here for 1 month follow-up of right knee prepatellar bursitis. He notes 99.9% improvement since his last visit. Has purchased a knee compression sleeve and done well with this. Only issue since last visit was after kneeling while setting up Christmas decorations, though resolved within a day with compression and Meloxicam . No new complaints.  ROS:     See HPI  PERTINENT  PMH / PSH / FH / SH:  Past Medical, Surgical, Social, and Family History Reviewed & Updated in the EMR.  Pertinent findings include:  Non-contributory  No Known Allergies  OBJECTIVE:  BP 136/84   Ht 5' 7 (1.702 m)   Wt 220 lb (99.8 kg)   BMI 34.46 kg/m   PHYSICAL EXAM:  GEN: Alert and Oriented, NAD, comfortable in exam room RESP: Unlabored respirations, symmetric chest rise PSY: normal mood, congruent affect   MSK EXAM: Right Knee without any deformity or swelling. No reproducible TTP. FROM. Normal gait.   Assessment & Plan Acute pain of right knee Resolved right prepatellar bursitis. Though to be reactive at last visit, no current concern for infection. Discussed continuation of as needed icing, compression, and NSAIDs. Reviewed avoidance/limitation of exacerbating activity such as kneeling. We will leave follow-up open ended moving forward.   Prentice Niece, MD PGY-4, Sports Medicine Fellow [facility]  Addendum:  I was the preceptor for this visit and available for immediate consultation.  Ludie Littler MD [facility]

## 2024-04-26 ENCOUNTER — Ambulatory Visit

## 2024-04-26 ENCOUNTER — Encounter: Payer: Self-pay | Admitting: Family Medicine

## 2024-04-26 ENCOUNTER — Ambulatory Visit: Admitting: Family Medicine

## 2024-04-26 VITALS — BP 128/70 | HR 76 | Ht 67.0 in | Wt 210.0 lb

## 2024-04-26 DIAGNOSIS — M545 Low back pain, unspecified: Secondary | ICD-10-CM

## 2024-04-26 DIAGNOSIS — M62830 Muscle spasm of back: Secondary | ICD-10-CM

## 2024-04-26 MED ORDER — TIZANIDINE HCL 4 MG PO TABS: 4.0000 mg | ORAL_TABLET | Freq: Every evening | ORAL | 2 refills | Status: AC

## 2024-04-26 MED ORDER — METHYLPREDNISOLONE ACETATE 80 MG/ML IJ SUSP
80.0000 mg | Freq: Once | INTRAMUSCULAR | Status: AC
  Administered 2024-04-26: 80 mg via INTRAMUSCULAR

## 2024-04-26 MED ORDER — KETOROLAC TROMETHAMINE 60 MG/2ML IM SOLN
60.0000 mg | Freq: Once | INTRAMUSCULAR | Status: AC
  Administered 2024-04-26: 60 mg via INTRAMUSCULAR

## 2024-04-26 NOTE — Assessment & Plan Note (Signed)
 Muscle spasm of the lower back.  Discussed icing regimen and home exercises, discussed which activities to do which ones to avoid.  Muscle relaxers Zanaflex  given.  Toradol  and Depo-Medrol  injections given.  X-rays showed some mild degenerative disc disease and some foraminal narrowing right greater than left mostly L5-S1 and overread pending.  Follow-up with me again in 6 to 8 weeks otherwise.

## 2024-04-26 NOTE — Patient Instructions (Signed)
 Good to see you.  Full cocktail injection given today.  Low back exercises. PT at [facility] if needed.  Have the appointment in January but give us  an update next week.

## 2024-04-26 NOTE — Progress Notes (Signed)
 [name] [address] [place] [address] Phone: [phone] Subjective:   [name] [name] am a scribe for Dr. Claudene.   I'm seeing this patient by the request  of:  [name] [name] ORN, [name]  CC: back pain  YEP:Dlagzrupcz  [name] [name] is a 63 y.o. male coming in with complaint of back pain. Patient states that is just started on Monday or last Saturday. No injury to cause the pain but just woke up with the pain. Pain is located in the low back. Touching it doesn't hurt. It is more internal pain. Bending over, sitting, pushing off out of seat. Took Ibuprofen this morning. Bought a back brace last night. It helps a little bit because he thinks that it traps heat. Took Tylenol yesterday morning. Wakes him up at night. If he sleeps on his back it isn't that bad but there is pain if he sleeps on his side.        No Known Allergies Family History  Problem Relation Age of Onset   Lung cancer Father    Colon cancer Father       Current Outpatient Medications (Respiratory):    albuterol  (VENTOLIN  HFA) 108 (90 Base) MCG/ACT inhaler, Inhale 2 puffs into the lungs every 6 (six) hours as needed for wheezing or shortness of breath.  Current Outpatient Medications (Analgesics):    meloxicam  (MOBIC ) 15 MG tablet, Take 1 tablet (15 mg total) by mouth daily.   Current Outpatient Medications (Other):    tiZANidine  (ZANAFLEX ) 4 MG tablet, Take 1 tablet (4 mg total) by mouth Nightly.   Biotin (BIOTIN 5000) 5 MG CAPS, Take 5,000 mg by mouth daily.   Multiple Vitamins-Minerals (OCUVITE EYE + MULTI PO), Take 1 capsule by mouth daily.   Omega-3 Fatty Acids (FISH OIL PO), Take 2 capsules by mouth daily. 1050mg  each   Reviewed prior external information including notes and imaging from  primary care provider As well as notes that were available from care everywhere and other healthcare systems.  Past medical history, social, surgical and family history  all reviewed in electronic medical record.  No pertanent information unless stated regarding to the chief complaint.   Review of Systems:  No headache, visual changes, nausea, vomiting, diarrhea, constipation, dizziness, abdominal pain, skin rash, fevers, chills, night sweats, weight loss, swollen lymph nodes, body aches, joint swelling, chest pain, shortness of breath, mood changes. POSITIVE muscle aches  Objective  Blood pressure 128/70, pulse 76, height 5' 7 (1.702 m), weight 210 lb (95.3 kg), SpO2 98%.   General: No apparent distress alert and oriented x3 mood and affect normal, dressed appropriately.  HEENT: Pupils equal, extraocular movements intact  Respiratory: Patient's speak in full sentences and does not appear short of breath  Cardiovascular: No lower extremity edema, non tender, no erythema  Low back exam does have some loss lordosis noted.  Some tenderness to palpation in the paraspinal musculature.  Tightness with FABER right greater than left noted minorly.  Tightness with straight leg test right greater than left as well though.  No true radicular symptoms.  Seems to be in the L3-L5 area and just barely right greater than left and no midline tenderness to palpation.   97110; 15 additional minutes spent for Therapeutic exercises as stated in above notes.  This included exercises focusing on stretching, strengthening, with significant focus on eccentric aspects.   Long term goals include an improvement in range of motion, strength, endurance as well as  avoiding reinjury. Patient's frequency would include in 1-2 times a day, 3-5 times a week for a duration of 6-12 weeks. Low back exercises that included:  Pelvic tilt/bracing instruction to focus on control of the pelvic girdle and lower abdominal muscles  Glute strengthening exercises, focusing on proper firing of the glutes without engaging the low back muscles Proper stretching techniques for maximum relief for the hamstrings, hip  flexors, low back and some rotation where tolerated  Proper technique shown and discussed handout in great detail with ATC.  All questions were discussed and answered.     Impression and Recommendations:     The above documentation has been reviewed and is accurate and complete Grisela Mesch M Vola Beneke, DO

## 2024-04-26 NOTE — Progress Notes (Deleted)
  [facility] [address] [place] [address] Phone: [phone] Subjective:    I'm seeing this patient by the request  of:  Tisovec, Charlie ORN, MD  CC:   [name] [name]  [name] [name] is a 63 y.o. male coming in with complaint of ***  Onset-  Location Duration-  Character- Aggravating factors- Reliving factors-  Therapies tried-  Severity-     No past medical history on file. *** The histories are not reviewed yet. Please review them in the History navigator section and refresh this SmartLink. Social History   Socioeconomic History   Marital status: Divorced    Spouse name: Not on file   Number of children: Not on file   Years of education: Not on file   Highest education level: Not on file  Occupational History   Occupation: Technical Sales Engineer  Tobacco Use   Smoking status: Never   Smokeless tobacco: Not on file  Substance and Sexual Activity   Alcohol  use: No    Alcohol /week: 0.0 standard drinks of alcohol     Comment: 2 weekly   Drug use: No   Sexual activity: Not on file  Other Topics Concern   Not on file  Social History Narrative   Not on file   Social Drivers of Health   Financial Resource Strain: Not on file  Food Insecurity: Not on file  Transportation Needs: Not on file  Physical Activity: Not on file  Stress: Not on file  Social Connections: Not on file   No Known Allergies Family History  Problem Relation Age of Onset   Lung cancer Father    Colon cancer Father       Current Outpatient Medications (Respiratory):    albuterol  (VENTOLIN  HFA) 108 (90 Base) MCG/ACT inhaler, Inhale 2 puffs into the lungs every 6 (six) hours as needed for wheezing or shortness of breath.  Current Outpatient Medications (Analgesics):    meloxicam  (MOBIC ) 15 MG tablet, Take 1 tablet (15 mg total) by mouth daily.   Current Outpatient Medications (Other):    Biotin (BIOTIN 5000) 5 MG CAPS, Take 5,000 mg by mouth daily.    Multiple Vitamins-Minerals (OCUVITE EYE + MULTI PO), Take 1 capsule by mouth daily.   Omega-3 Fatty Acids (FISH OIL PO), Take 2 capsules by mouth daily. 1050mg  each   Reviewed prior external information including notes and imaging from  primary care provider As well as notes that were available from care everywhere and other healthcare systems.  Past medical history, social, surgical and family history all reviewed in electronic medical record.  No pertanent information unless stated regarding to the chief complaint.   Review of Systems:  No headache, visual changes, nausea, vomiting, diarrhea, constipation, dizziness, abdominal pain, skin rash, fevers, chills, night sweats, weight loss, swollen lymph nodes, body aches, joint swelling, chest pain, shortness of breath, mood changes. POSITIVE muscle aches  Objective  There were no vitals taken for this visit.   General: No apparent distress alert and oriented x3 mood and affect normal, dressed appropriately.  HEENT: Pupils equal, extraocular movements intact  Respiratory: Patient's speak in full sentences and does not appear short of breath  Cardiovascular: No lower extremity edema, non tender, no erythema      Impression and Recommendations:

## 2024-05-02 ENCOUNTER — Ambulatory Visit: Payer: Self-pay | Admitting: Family Medicine

## 2024-06-17 NOTE — Therapy (Unsigned)
 " OUTPATIENT PHYSICAL THERAPY THORACOLUMBAR EVALUATION ONE TIME EVAL    Patient Name: [name] [name] MRN: [id number] DOB:1961-05-09, 64 y.o., male Today's Date: 06/18/2024  END OF SESSION:  PT End of Session - 06/18/24 0858     Visit Number 1    Number of Visits 1    Authorization Type MCR    PT Start Time 0847    PT Stop Time 0930    PT Time Calculation (min) 43 min    Activity Tolerance Patient tolerated treatment well    Behavior During Therapy [facility] for tasks assessed/performed          History reviewed. No pertinent past medical history. Past Surgical History:  Procedure Laterality Date   POLYPECTOMY     Patient Active Problem List   Diagnosis Date Noted   Spasm of muscle of lower back 04/26/2024   DOE (dyspnea on exertion) 10/17/2015    PCP: Tisovec, Richard, MD   REFERRING PROVIDER: Claudene Hussar DO   REFERRING DIAG: acute low back pain   Rationale for Evaluation and Treatment: Rehabilitation  THERAPY DIAG:  Other low back pain  ONSET DATE: Nov. 2025  SUBJECTIVE:                                                                                                                                                                                           SUBJECTIVE STATEMENT: Patient had a sudden onset of back pain which was non specific in nature.  He is currently moving a close friend and thinks this may have been the culprit.  He reports time and MD intervention has improved his pain.   At that time he did not have pain with walking, only to bend/lift.  The pain didn't radiate Pain was central and no weakness.  He thought that by coming to physical therapy today he could figure out how to prevent this from happening again and give him something to work on for the future.  PERTINENT HISTORY:  None relevant   PAIN:  Are you having pain? No At the time of injury the pain was generalized to the lower back above the belt line Pain was aggravated by  bending And was improved by positioning time rest   PRECAUTIONS: None  RED FLAGS: None   WEIGHT BEARING RESTRICTIONS: No  FALLS:  Has patient fallen in last 6 months? No  LIVING ENVIRONMENT: Lives with: lives with their family and lives with their partner Lives in: House/apartment Stairs: Yes: Internal: 12+ no issues steps; on right going up Has following equipment at home: None  OCCUPATION: Retired technical sales engineer  PLOF: Independent and Leisure: Participates in  a walking group weekly, used to do yoga regularly and would like to be able to do that again  PATIENT GOALS: I want this to not happen again  NEXT MD VISIT: As needed in the future  OBJECTIVE:  Note: Objective measures were completed at Evaluation unless otherwise noted.  DIAGNOSTIC FINDINGS:  Narrative & Impression  EXAM: 2 or 3 VIEW(S) XRAY OF THE LUMBAR SPINE 04/26/2024 11:20:24 AM   COMPARISON: None available.   CLINICAL HISTORY: back pain   FINDINGS:   LUMBAR SPINE: BONES: No acute fracture. No aggressive appearing osseous lesion. Lumbar alignment within normal limits.   DISCS AND DEGENERATIVE CHANGES: Mild multilevel degenerative osteophytes. Mild lower lumbar facet degenerative changes.   SOFT TISSUES: No acute abnormality.   VASCULATURE: Aortic atherosclerosis.   IMPRESSION: 1. No acute abnormality of the lumbar spine. 2. Mild degenerative changes    PATIENT SURVEYS:  None on evaluation 1 time  COGNITION: Overall cognitive status: Within functional limits for tasks assessed     SENSATION: WFL  MUSCLE LENGTH: Hamstrings: Tight bilaterally Thomas test: Not tested  POSTURE: rounded shoulders and forward head  PALPATION: Min soreness with palpation to the general lumbar spine region L3-L5  LUMBAR ROM: WFL just stretching with rotation   AROM eval  Flexion   Extension   Right lateral flexion   Left lateral flexion   Right rotation   Left rotation    (Blank rows = not  tested)  LOWER EXTREMITY ROM:   Within functional limits   LOWER EXTREMITY MMT: Within functional limits  LUMBAR SPECIAL TESTS:  Negative straight leg raise  FUNCTIONAL TESTS:  Observation of functional movements includes squat, sit to stand and hip hinge with and without weight  GAIT: Distance walked: 100 Assistive device utilized: None Level of assistance: Complete Independence Comments: No deviations or issues  TREATMENT DATE:   06/18/24: Patient completed physical therapy evaluation today he has normal strength and hip range of motion.  Patient was given home exercise program to maintain trunk mobility hip mobility and was educated in the importance of proper form with lifting and body mechanics.           Time spent utilizing a dowel behind his back to practice the hip hinge in addition to lifting a larger stack of risers and a 15 pound kettle bell.                                                                                                                        PATIENT EDUCATION:  Education details: Posture, lifting, body mechanics, importance of resistance training movement in general for preventing increased back pain Person educated: Patient Education method: Explanation, Demonstration, Verbal cues, and Handouts Education comprehension: verbalized understanding, returned demonstration, and tactile cues required  HOME EXERCISE PROGRAM: Access Code: [email or web address] URL: [email or web address] Date: 06/18/2024 Prepared by: Delon Norma  Exercises - Supine Lower Trunk Rotation  - 1 x daily - 7 x weekly - 2 sets - 10 reps - 10-15  hold - Supine Single Knee to Chest Stretch  - 1 x daily - 7 x weekly - 1 sets - 3-5 reps - 30 hold - Supine Hamstring Stretch with Strap  - 1 x daily - 7 x weekly - 1 sets - 3-5 reps - 30 hold - Supine Bridge  - 1 x daily - 7 x weekly - 2 sets - 10 reps - 5 hold - Cat Cow to Child's Pose  - 1 x daily - 7 x weekly - 2 sets - 5 reps -  30 hold - Standing Hip Hinge with Dowel  - 1 x daily - 7 x weekly - 1 sets - 10 reps - 5 hold  Patient Education - Posture and Body Mechanics  ASSESSMENT:  CLINICAL IMPRESSION: Patient is a 63y.o. male who was seen today for physical therapy evaluation and treatment for acute lower back pain which has since resolved.  He was given information to p hopefully revent this from happening again.  He did need some cueing for proper hip hinge and lifting mechanics.  He is in the process of helping a friend move and does routinely lift medium size objects, stressing the importance of form and intentional use of lower body versus a flexed spine.  OBJECTIVE IMPAIRMENTS: improper body mechanics.   ACTIVITY LIMITATIONS: lifting and bending  PARTICIPATION LIMITATIONS: community activity  PERSONAL FACTORS: Repeated movement patterns  are also affecting patient's functional outcome.   REHAB POTENTIAL: Excellent  CLINICAL DECISION MAKING: Stable/uncomplicated  EVALUATION COMPLEXITY: Low   GOALS: Goals reviewed with patient? Yes  SHORT TERM GOALS= Long term goals Target date: 06/18/24  Patient will be able to demonstrate safe lifting techniques for items less than 25 pounds Baseline: Goal status:met  2.  Patient will be given a home exercise program for lumbar and hip mobility  Baseline:  Goal status: Met   PT FREQUENCY: one time visit  PT DURATION: 1 sessions  PLANNED INTERVENTIONS: 97110-Therapeutic exercises, 97535- Self Care, and Patient/Family education.  PLAN FOR NEXT SESSION: NA, DC    Wayman Hoard, PT 06/18/2024, 9:43 AM  "

## 2024-06-18 ENCOUNTER — Encounter: Payer: Self-pay | Admitting: Physical Therapy

## 2024-06-18 ENCOUNTER — Ambulatory Visit (INDEPENDENT_AMBULATORY_CARE_PROVIDER_SITE_OTHER): Admitting: Physical Therapy

## 2024-06-18 DIAGNOSIS — M5459 Other low back pain: Secondary | ICD-10-CM | POA: Diagnosis not present

## 2024-06-27 NOTE — Progress Notes (Unsigned)
 " [facility] [address] [place] [address] Phone: [phone] Subjective:   [name], am serving as a scribe for Dr. Arthea Claudene.  I'm seeing this patient by the request  of:  Tisovec, Charlie ORN, MD  CC: Low back pain  YEP:Dlagzrupcz  04/26/2024 Muscle spasm of the lower back.  Discussed icing regimen and home exercises, discussed which activities to do which ones to avoid.  Muscle relaxers Zanaflex  given.  Toradol  and Depo-Medrol  injections given.  X-rays showed some mild degenerative disc disease and some foraminal narrowing right greater than left mostly L5-S1 and overread pending.  Follow-up with me again in 6 to 8 weeks otherwise.     Update 06/28/2024 [name] [name] is a 64 y.o. male coming in with complaint of lumbar spine pain.  Was seen 2 months ago and had more of a muscle spasm.  X-rays did show some mild degenerative disc disease.  Has been to physical therapy 1 time 10 days ago.  Medications prescribed include Zanaflex .  Patient states has not been having any pain. Malaria medications. Has been diagnosed with celiac disease.       No past medical history on file. Past Surgical History:  Procedure Laterality Date   POLYPECTOMY     Social History   Socioeconomic History   Marital status: Divorced    Spouse name: Not on file   Number of children: Not on file   Years of education: Not on file   Highest education level: Not on file  Occupational History   Occupation: Technical Sales Engineer  Tobacco Use   Smoking status: Never   Smokeless tobacco: Not on file  Substance and Sexual Activity   Alcohol  use: No    Alcohol /week: 0.0 standard drinks of alcohol     Comment: 2 weekly   Drug use: No   Sexual activity: Not on file  Other Topics Concern   Not on file  Social History Narrative   Not on file   Social Drivers of Health   Tobacco Use: Unknown (06/18/2024)   Patient History    Smoking Tobacco Use: Never    Smokeless  Tobacco Use: Unknown    Passive Exposure: Not on file  Financial Resource Strain: Not on file  Food Insecurity: Not on file  Transportation Needs: Not on file  Physical Activity: Not on file  Stress: Not on file  Social Connections: Not on file  Depression (EYV7-0): Not on file  Alcohol  Screen: Not on file  Housing: Unknown (01/24/2024)   Received from [facility]   Epic    Unable to Pay for Housing in the Last Year: Not on file    Number of Times Moved in the Last Year: Not on file    At any time in the past 12 months, were you homeless or living in a shelter (including now)?: No  Utilities: Not on file  Health Literacy: Not on file   Allergies[1] Family History  Problem Relation Age of Onset   Lung cancer Father    Colon cancer Father     Current Outpatient Medications (Respiratory):    albuterol  (VENTOLIN  HFA) 108 (90 Base) MCG/ACT inhaler, Inhale 2 puffs into the lungs every 6 (six) hours as needed for wheezing or shortness of breath.  Current Outpatient Medications (Analgesics):    meloxicam  (MOBIC ) 15 MG tablet, Take 1 tablet (15 mg total) by mouth daily.  Current Outpatient Medications (Other):    Biotin (BIOTIN 5000) 5 MG  CAPS, Take 5,000 mg by mouth daily.   Multiple Vitamins-Minerals (OCUVITE EYE + MULTI PO), Take 1 capsule by mouth daily.   Omega-3 Fatty Acids (FISH OIL PO), Take 2 capsules by mouth daily. 1050mg  each   tiZANidine  (ZANAFLEX ) 4 MG tablet, Take 1 tablet (4 mg total) by mouth Nightly.   Objective  Blood pressure 124/76, pulse 84, height 5' 7 (1.702 m), weight 215 lb (97.5 kg), SpO2 97%.   General: No apparent distress alert and oriented x3 mood and affect normal, dressed appropriately.  HEENT: Pupils equal, extraocular movements intact  Respiratory: Patient's speak in full sentences and does not appear short of breath  Cardiovascular: No lower extremity edema, non tender, no erythema  Low back exam shows mild loss of lordosis  noted.  Nontender, can go from a seated to standing position without any significant difficulty   Impression and Recommendations:     The above documentation has been reviewed and is accurate and complete [name], DO       [1] No Known Allergies  "

## 2024-06-28 ENCOUNTER — Ambulatory Visit: Admitting: Family Medicine

## 2024-06-28 VITALS — BP 124/76 | HR 84 | Ht 67.0 in | Wt 215.0 lb

## 2024-06-28 DIAGNOSIS — M62830 Muscle spasm of back: Secondary | ICD-10-CM | POA: Diagnosis not present

## 2024-06-28 NOTE — Assessment & Plan Note (Signed)
 100% improved at this time.  Did discuss with him about potential malaria medication with patient traveling.  I be happy to do it but would like to know what type of medication he is looking for with some resistance noted and [place] previously.  Follow-up with me otherwise as needed
